# Patient Record
Sex: Female | Born: 1945 | Race: Black or African American | Hispanic: No | Smoking: Never smoker
Health system: Southern US, Community
[De-identification: ages and names within clinical notes are randomized; demographics above are authoritative.]

## PROBLEM LIST (undated history)

## (undated) DIAGNOSIS — I10 Essential (primary) hypertension: Secondary | ICD-10-CM

## (undated) DIAGNOSIS — E7800 Pure hypercholesterolemia, unspecified: Secondary | ICD-10-CM

## (undated) DIAGNOSIS — E119 Type 2 diabetes mellitus without complications: Secondary | ICD-10-CM

## (undated) HISTORY — PX: CATARACT EXTRACTION, BILATERAL: SHX1313

---

## 2010-03-27 ENCOUNTER — Emergency Department (HOSPITAL_BASED_OUTPATIENT_CLINIC_OR_DEPARTMENT_OTHER)
Admission: EM | Admit: 2010-03-27 | Discharge: 2010-03-27 | Disposition: A | Discharge status: Home / Self Care | Payer: No Typology Code available for payment source | Attending: Emergency Medicine | Admitting: Emergency Medicine

## 2010-03-27 DIAGNOSIS — N390 Urinary tract infection, site not specified: Secondary | ICD-10-CM | POA: Insufficient documentation

## 2010-03-27 DIAGNOSIS — I10 Essential (primary) hypertension: Secondary | ICD-10-CM | POA: Insufficient documentation

## 2010-03-27 DIAGNOSIS — K920 Hematemesis: Secondary | ICD-10-CM | POA: Insufficient documentation

## 2010-03-27 DIAGNOSIS — Z79899 Other long term (current) drug therapy: Secondary | ICD-10-CM | POA: Insufficient documentation

## 2010-03-27 DIAGNOSIS — E7800 Pure hypercholesterolemia, unspecified: Secondary | ICD-10-CM | POA: Insufficient documentation

## 2010-03-27 LAB — CBC
Hemoglobin: 12.9 g/dL (ref 12.0–15.0)
MCH: 27.4 pg (ref 26.0–34.0)
MCV: 79.8 fL (ref 78.0–100.0)
RBC: 4.7 MIL/uL (ref 3.87–5.11)

## 2010-03-27 LAB — URINALYSIS, ROUTINE W REFLEX MICROSCOPIC
Bilirubin Urine: NEGATIVE
Hgb urine dipstick: NEGATIVE
Ketones, ur: NEGATIVE mg/dL
Protein, ur: NEGATIVE mg/dL
Urobilinogen, UA: 0.2 mg/dL (ref 0.0–1.0)

## 2010-03-27 LAB — DIFFERENTIAL
Basophils Relative: 0 % (ref 0–1)
Lymphocytes Relative: 41 % (ref 12–46)
Lymphs Abs: 2 10*3/uL (ref 0.7–4.0)
Monocytes Relative: 7 % (ref 3–12)
Neutro Abs: 2.4 10*3/uL (ref 1.7–7.7)
Neutrophils Relative %: 49 % (ref 43–77)

## 2010-03-27 LAB — URINE MICROSCOPIC-ADD ON

## 2010-03-27 LAB — COMPREHENSIVE METABOLIC PANEL
AST: 22 U/L (ref 0–37)
CO2: 24 mEq/L (ref 19–32)
Chloride: 108 mEq/L (ref 96–112)
Creatinine, Ser: 0.7 mg/dL (ref 0.4–1.2)
GFR calc Af Amer: >60 mL/min (ref >60)
GFR calc non Af Amer: >60 mL/min (ref >60)
Total Bilirubin: 0.4 mg/dL (ref 0.3–1.2)

## 2016-05-16 ENCOUNTER — Emergency Department (HOSPITAL_BASED_OUTPATIENT_CLINIC_OR_DEPARTMENT_OTHER)
Admission: EM | Admit: 2016-05-16 | Discharge: 2016-05-16 | Disposition: A | Discharge status: Home / Self Care | Payer: Medicare Other | Attending: Emergency Medicine | Admitting: Emergency Medicine

## 2016-05-16 ENCOUNTER — Emergency Department (HOSPITAL_BASED_OUTPATIENT_CLINIC_OR_DEPARTMENT_OTHER): Payer: Medicare Other

## 2016-05-16 ENCOUNTER — Encounter (HOSPITAL_BASED_OUTPATIENT_CLINIC_OR_DEPARTMENT_OTHER): Payer: Self-pay

## 2016-05-16 DIAGNOSIS — M79662 Pain in left lower leg: Secondary | ICD-10-CM | POA: Diagnosis present

## 2016-05-16 DIAGNOSIS — Z7984 Long term (current) use of oral hypoglycemic drugs: Secondary | ICD-10-CM | POA: Diagnosis not present

## 2016-05-16 DIAGNOSIS — I10 Essential (primary) hypertension: Secondary | ICD-10-CM | POA: Insufficient documentation

## 2016-05-16 DIAGNOSIS — Z7982 Long term (current) use of aspirin: Secondary | ICD-10-CM | POA: Diagnosis not present

## 2016-05-16 DIAGNOSIS — Z79899 Other long term (current) drug therapy: Secondary | ICD-10-CM | POA: Insufficient documentation

## 2016-05-16 DIAGNOSIS — E119 Type 2 diabetes mellitus without complications: Secondary | ICD-10-CM | POA: Insufficient documentation

## 2016-05-16 HISTORY — DX: Type 2 diabetes mellitus without complications: E11.9

## 2016-05-16 HISTORY — DX: Pure hypercholesterolemia, unspecified: E78.00

## 2016-05-16 HISTORY — DX: Essential (primary) hypertension: I10

## 2016-05-16 NOTE — ED Provider Notes (Signed)
[facility] Provider Note   CSN: [id number] Arrival date & time: 05/16/16  1501     History   Chief Complaint Chief Complaint  Patient presents with  . Leg Pain    HPI Julie Holt is a 71 y.o. female.  71 yo F with a chief complaint of left lower leg pain. Going on for the past week. Aching. Located to the anterior aspect of the left shin. Worse with the first few steps in the morning. Denies injury. Denies fever. Denies chest pain or shortness of breath. Called her family physician who suggested she come and get ruled out for a blood clot.   The history is provided by the patient.  Leg Pain   This is a new problem. The current episode started less than 1 hour ago. The problem occurs constantly. The problem has not changed since onset.The pain is present in the left lower leg. The quality of the pain is described as sharp. The pain is at a severity of 3/10. The pain is mild. She has tried nothing for the symptoms. The treatment provided no relief. There has been no history of extremity trauma.    Past Medical History:  Diagnosis Date  . Diabetes mellitus without complication (HCC)   . High cholesterol   . Hypertension     There are no active problems to display for this patient.   Past Surgical History:  Procedure Laterality Date  . CATARACT EXTRACTION, BILATERAL      OB History    No data available       Home Medications    Prior to Admission medications   Medication Sig Start Date End Date Taking? Authorizing Provider  amLODipine (NORVASC) 10 MG tablet Take 10 mg by mouth daily.   Yes Historical Provider, MD  aspirin EC 81 MG tablet Take 81 mg by mouth daily.   Yes Historical Provider, MD  cholecalciferol (VITAMIN D) 1000 units tablet Take 2,000 Units by mouth daily.   Yes Historical Provider, MD  ciprofloxacin (CILOXAN) 0.3 % ophthalmic solution Place 2 drops into both eyes every 2 (two) hours. Administer 1 drop, every 2 hours, while awake, for 2  days. Then 1 drop, every 4 hours, while awake, for the next 5 days.   Yes Historical Provider, MD  Difluprednate (DUREZOL) 0.05 % EMUL Apply to eye.   Yes Historical Provider, MD  dorzolamide-timolol (COSOPT) 22.3-6.8 MG/ML ophthalmic solution Place 1 drop into both eyes 2 (two) times daily.   Yes Historical Provider, MD  famciclovir (FAMVIR) 500 MG tablet Take 500 mg by mouth 3 (three) times daily.   Yes Historical Provider, MD  fluticasone (FLONASE) 50 MCG/ACT nasal spray Place 2 sprays into both nostrils daily.   Yes Historical Provider, MD  glucose blood test strip 1 each by Other route as needed for other. Use as instructed   Yes Historical Provider, MD  hydrochlorothiazide (MICROZIDE) 12.5 MG capsule Take 12.5 mg by mouth daily.   Yes Historical Provider, MD  ketorolac (ACULAR) 0.4 % SOLN 1 drop 4 (four) times daily.   Yes Historical Provider, MD  latanoprost (XALATAN) 0.005 % ophthalmic solution Place 1 drop into both eyes at bedtime.   Yes Historical Provider, MD  lisinopril (PRINIVIL,ZESTRIL) 20 MG tablet Take 20 mg by mouth daily.   Yes Historical Provider, MD  meloxicam (MOBIC) 7.5 MG tablet Take 7.5 mg by mouth daily.   Yes Historical Provider, MD  metFORMIN (GLUCOPHAGE) 500 MG tablet Take 1,000 mg by mouth  2 (two) times daily with a meal.   Yes Historical Provider, MD  Olopatadine HCl (PAZEO) 0.7 % SOLN Apply to eye.   Yes Historical Provider, MD  omeprazole (PRILOSEC) 40 MG capsule Take 40 mg by mouth daily.   Yes Historical Provider, MD  rosuvastatin (CRESTOR) 5 MG tablet Take 5 mg by mouth at bedtime.   Yes Historical Provider, MD    Family History No family history on file.  Social History Social History  Substance Use Topics  . Smoking status: Never Smoker  . Smokeless tobacco: Never Used  . Alcohol use No     Allergies   Codeine and Tramadol   Review of Systems Review of Systems  Constitutional: Negative for chills and fever.  HENT: Negative for congestion and  rhinorrhea.   Eyes: Negative for redness and visual disturbance.  Respiratory: Negative for shortness of breath and wheezing.   Cardiovascular: Negative for chest pain and palpitations.  Gastrointestinal: Negative for nausea and vomiting.  Genitourinary: Negative for dysuria and urgency.  Musculoskeletal: Positive for arthralgias and myalgias.  Skin: Negative for pallor and wound.  Neurological: Negative for dizziness and headaches.     Physical Exam Updated Vital Signs BP (!) 170/95 (BP Location: Right Arm)   Pulse (!) 56   Temp 97.9 F (36.6 C) (Oral)   Resp 17   Ht 5' (1.524 m)   Wt 194 lb (88 kg)   SpO2 99%   BMI 37.89 kg/m   Physical Exam  Constitutional: She is oriented to person, place, and time. She appears well-developed and well-nourished. No distress.  HENT:  Head: Normocephalic and atraumatic.  Eyes: EOM are normal. Pupils are equal, round, and reactive to light.  Neck: Normal range of motion. Neck supple.  Cardiovascular: Normal rate and regular rhythm.  Exam reveals no gallop and no friction rub.   No murmur heard. Pulmonary/Chest: Effort normal. She has no wheezes. She has no rales.  Abdominal: Soft. She exhibits no distension. There is no tenderness. There is no guarding.  Musculoskeletal: She exhibits no edema or tenderness.  Mild left anterior shin tenderness  Neurological: She is alert and oriented to person, place, and time.  Skin: Skin is warm and dry. She is not diaphoretic.  Psychiatric: She has a normal mood and affect. Her behavior is normal.  Nursing note and vitals reviewed.    ED Treatments / Results  Labs (all labs ordered are listed, but only abnormal results are displayed) Labs Reviewed - No data to display  EKG  EKG Interpretation None       Radiology US Venous Img Lower Unilateral Left  Result Date: 05/16/2016 CLINICAL DATA:  Left anterior lower leg pain for 2 months. EXAM: LEFT LOWER EXTREMITY VENOUS DOPPLER ULTRASOUND  TECHNIQUE: Gray-scale sonography with graded compression, as well as color Doppler and duplex ultrasound were performed to evaluate the lower extremity deep venous systems from the level of the common femoral vein and including the common femoral, femoral, profunda femoral, popliteal and calf veins including the posterior tibial, peroneal and gastrocnemius veins when visible. The superficial great saphenous vein was also interrogated. Spectral Doppler was utilized to evaluate flow at rest and with distal augmentation maneuvers in the common femoral, femoral and popliteal veins. COMPARISON:  None. FINDINGS: Contralateral Common Femoral Vein: Respiratory phasicity is normal and symmetric with the symptomatic side. No evidence of thrombus. Normal compressibility. Common Femoral Vein: No evidence of thrombus. Normal compressibility, respiratory phasicity and response to augmentation. Saphenofemoral Junction: No evidence  of thrombus. Normal compressibility and flow on color Doppler imaging. Profunda Femoral Vein: No evidence of thrombus. Normal compressibility and flow on color Doppler imaging. Femoral Vein: No evidence of thrombus. Normal compressibility, respiratory phasicity and response to augmentation. Popliteal Vein: No evidence of thrombus. Normal compressibility, respiratory phasicity and response to augmentation. Calf Veins: No evidence of thrombus. Normal compressibility and flow on color Doppler imaging. Superficial Great Saphenous Vein: No evidence of thrombus. Normal compressibility and flow on color Doppler imaging. Venous Reflux:  None. Other Findings:  None. IMPRESSION: No evidence of DVT with the left lower extremity. Electronically Signed   By: Amie Portland M.D.   On: 05/16/2016 16:05    Procedures Procedures (including critical care time)  Medications Ordered in ED Medications - No data to display   Initial Impression / Assessment and Plan / ED Course  I have reviewed the triage vital signs  and the nursing notes.  Pertinent labs & imaging results that were available during my care of the patient were reviewed by me and considered in my medical decision making (see chart for details).     71 yo F with a chief complaints of left anterior shin pain. No signs of trauma. No pain on exam. Ultrasound was ordered in triage.  Negative for DVT.  D/c home.  7:53 PM:  I have discussed the diagnosis/risks/treatment options with the patient and believe the pt to be eligible for discharge home to follow-up with PCP. We also discussed returning to the ED immediately if new or worsening sx occur. We discussed the sx which are most concerning (e.g., sudden worsening pain, fever, inability to tolerate by mouth) that necessitate immediate return. Medications administered to the patient during their visit and any new prescriptions provided to the patient are listed below.  Medications given during this visit Medications - No data to display   The patient appears reasonably screen and/or stabilized for discharge and I doubt any other medical condition or other [facility] requiring further screening, evaluation, or treatment in the ED at this time prior to discharge.    Final Clinical Impressions(s) / ED Diagnoses   Final diagnoses:  Pain in left shin    New Prescriptions Discharge Medication List as of 05/16/2016  5:42 PM       Melene Plan, DO 05/16/16 1953

## 2016-05-16 NOTE — ED Triage Notes (Signed)
c/o pain to anterior left LE, tib/fib area x 1 month-denies injury-NAD-steady gait

## 2016-05-16 NOTE — ED Notes (Signed)
ED Provider at bedside. 

## 2016-05-16 NOTE — Discharge Instructions (Signed)
Take tylenol 1000mg(2 extra strength) four times a day.  ° °

## 2018-05-11 IMAGING — US US EXTREM LOW VENOUS*L*
1 series · 13 of 24 positions shown · non-contrast
Comparison: None.

CLINICAL DATA: Left anterior lower leg pain for 2 months.



[Series 1: us extrem low venous*left* · 0.08mm/px · 13 of 28 slices shown]
[im 1/28]
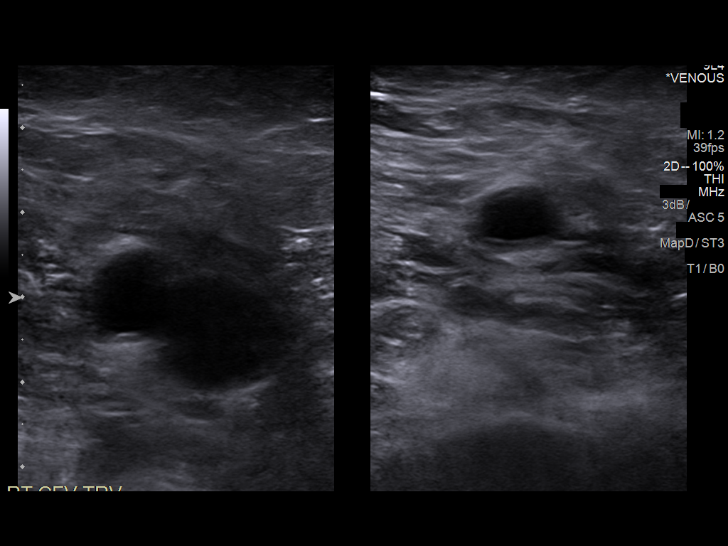
[im 3/28]
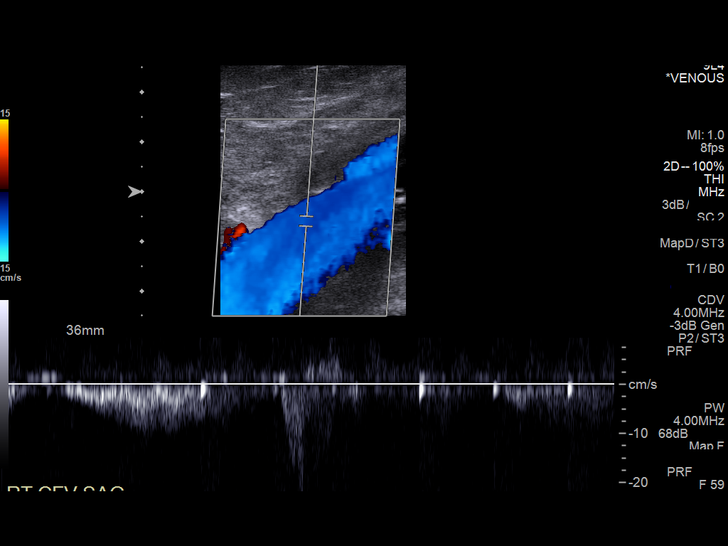
[im 5/28]
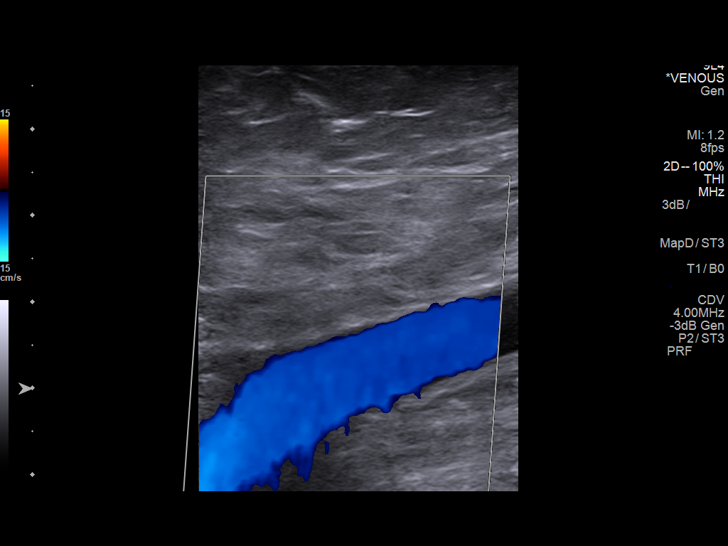
[im 8/28]
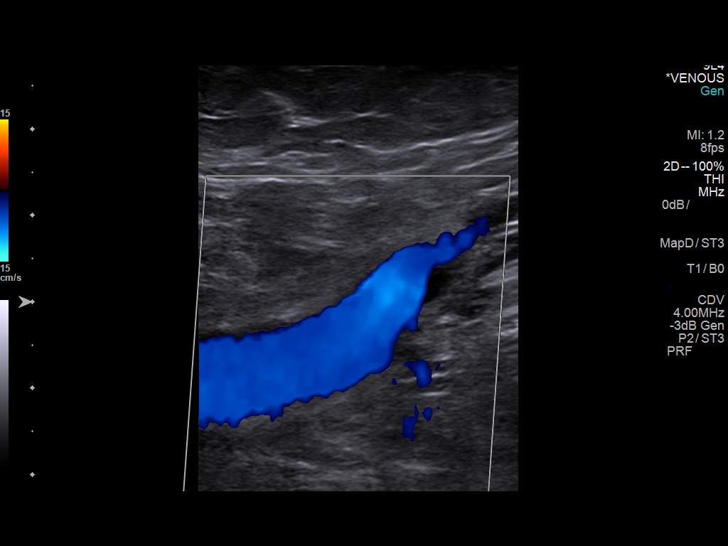
[im 10/28]
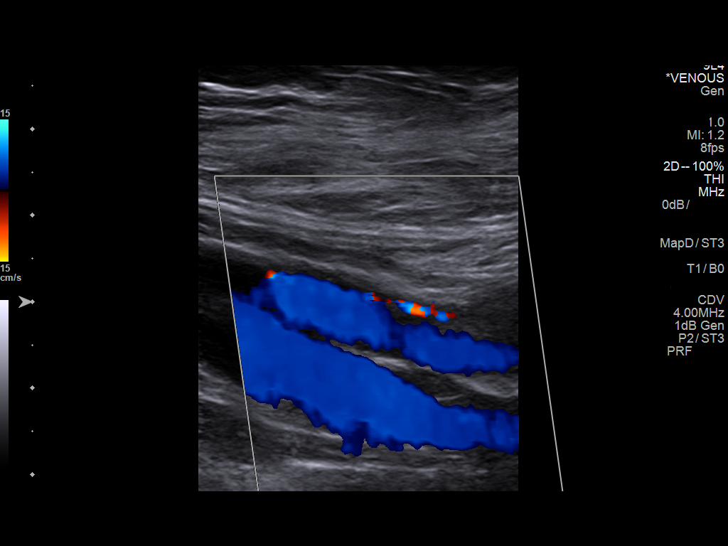
[im 12/28]
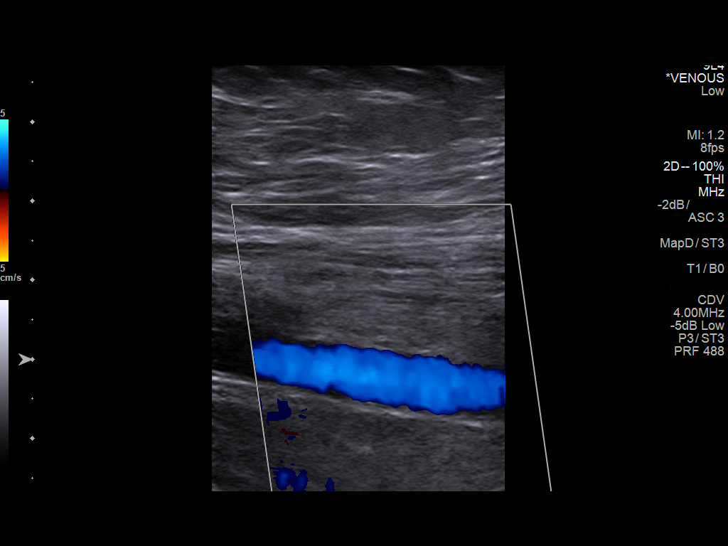
[im 15/28]
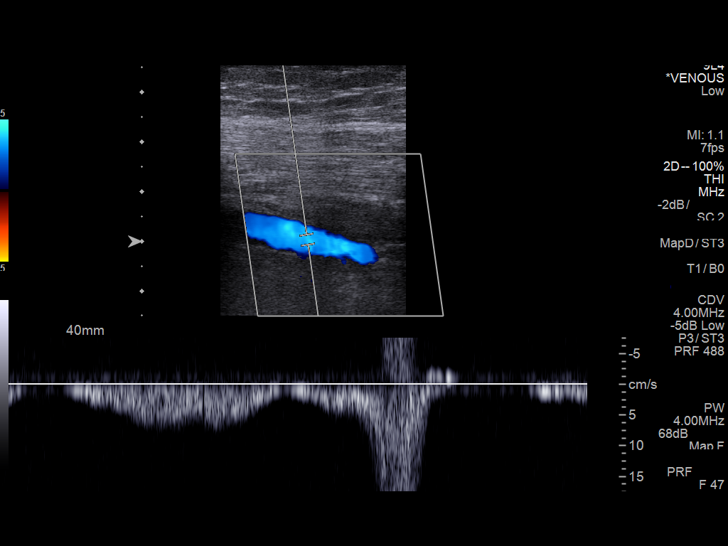
[im 16/28]
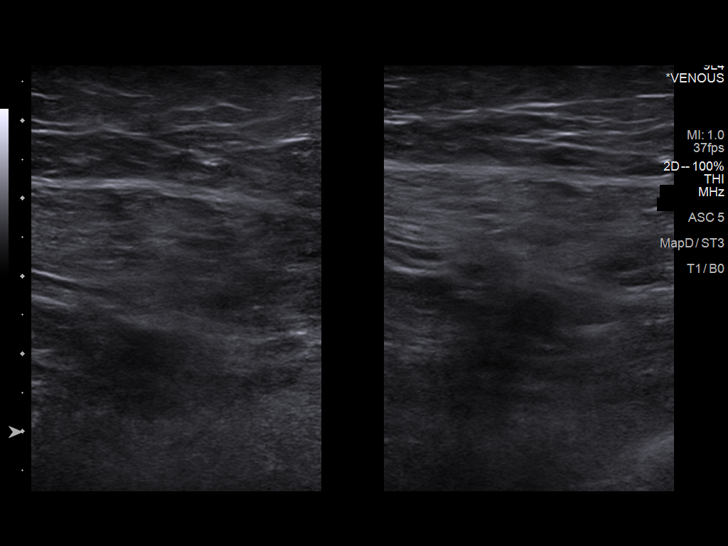
[im 18/28]
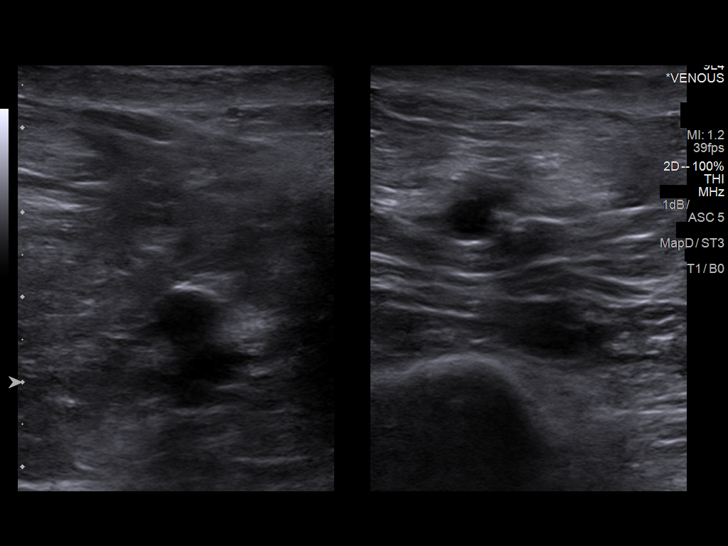
[im 20/28]
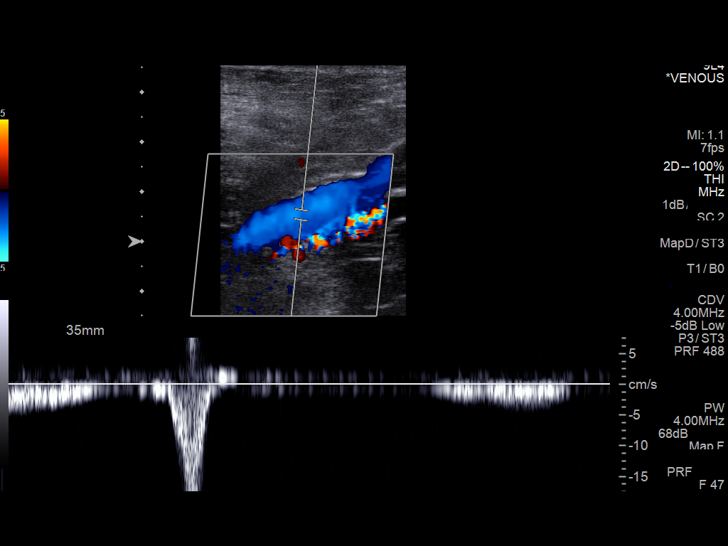
[im 23/28]
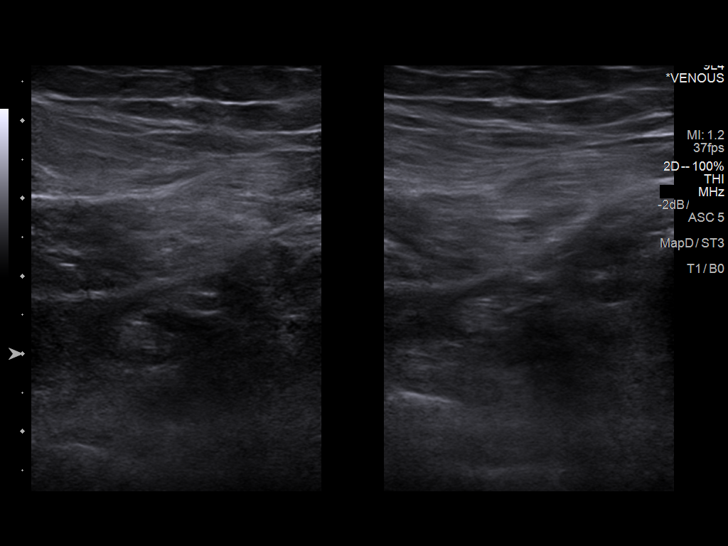
[im 25/28]
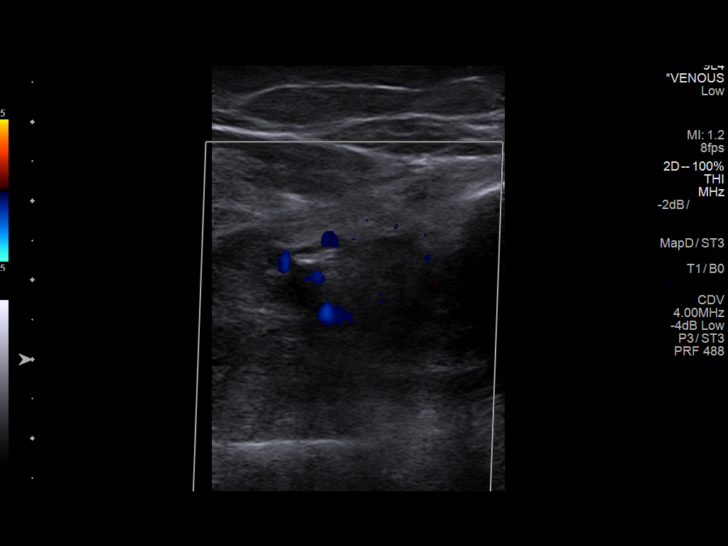
[im 28/28]
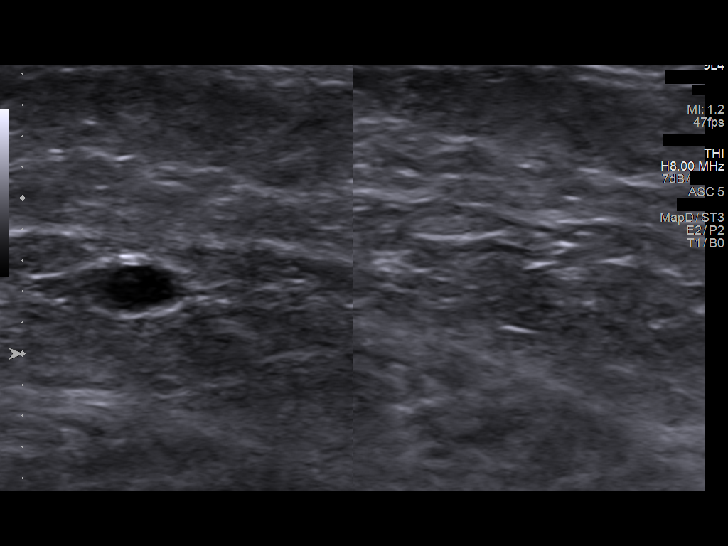

[13 of 24 positions shown; findings below may reference images not displayed]

FINDINGS: Contralateral Common Femoral Vein: Respiratory phasicity is normal
and symmetric with the symptomatic side. No evidence of thrombus.
Normal compressibility.

Common Femoral Vein: No evidence of thrombus. Normal
compressibility, respiratory phasicity and response to augmentation.

Saphenofemoral Junction: No evidence of thrombus. Normal
compressibility and flow on color Doppler imaging.

Profunda Femoral Vein: No evidence of thrombus. Normal
compressibility and flow on color Doppler imaging.

Femoral Vein: No evidence of thrombus. Normal compressibility,
respiratory phasicity and response to augmentation.

Popliteal Vein: No evidence of thrombus. Normal compressibility,
respiratory phasicity and response to augmentation.

Calf Veins: No evidence of thrombus. Normal compressibility and flow
on color Doppler imaging.

Superficial Great Saphenous Vein: No evidence of thrombus. Normal
compressibility and flow on color Doppler imaging.

Venous Reflux:  None.

Other Findings:  None.
IMPRESSION: No evidence of DVT with the left lower extremity.
